# Patient Record
Sex: Female | Born: 2000 | Race: White | Hispanic: No | Marital: Single
Health system: Southern US, Community
[De-identification: ages and names within clinical notes are randomized; demographics above are authoritative.]

## PROBLEM LIST (undated history)

## (undated) DIAGNOSIS — F419 Anxiety disorder, unspecified: Secondary | ICD-10-CM

## (undated) DIAGNOSIS — F909 Attention-deficit hyperactivity disorder, unspecified type: Secondary | ICD-10-CM

## (undated) HISTORY — PX: NO PAST SURGERIES: SHX2092

---
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
## 2022-01-09 IMAGING — CR DG THORACIC SPINE 2V
3 series · 3 of 3 positions shown · non-contrast
Comparison: None.

CLINICAL DATA: Mid back pain for years.

EXAM:
THORACIC SPINE 2 VIEWS

[t-spine ap]
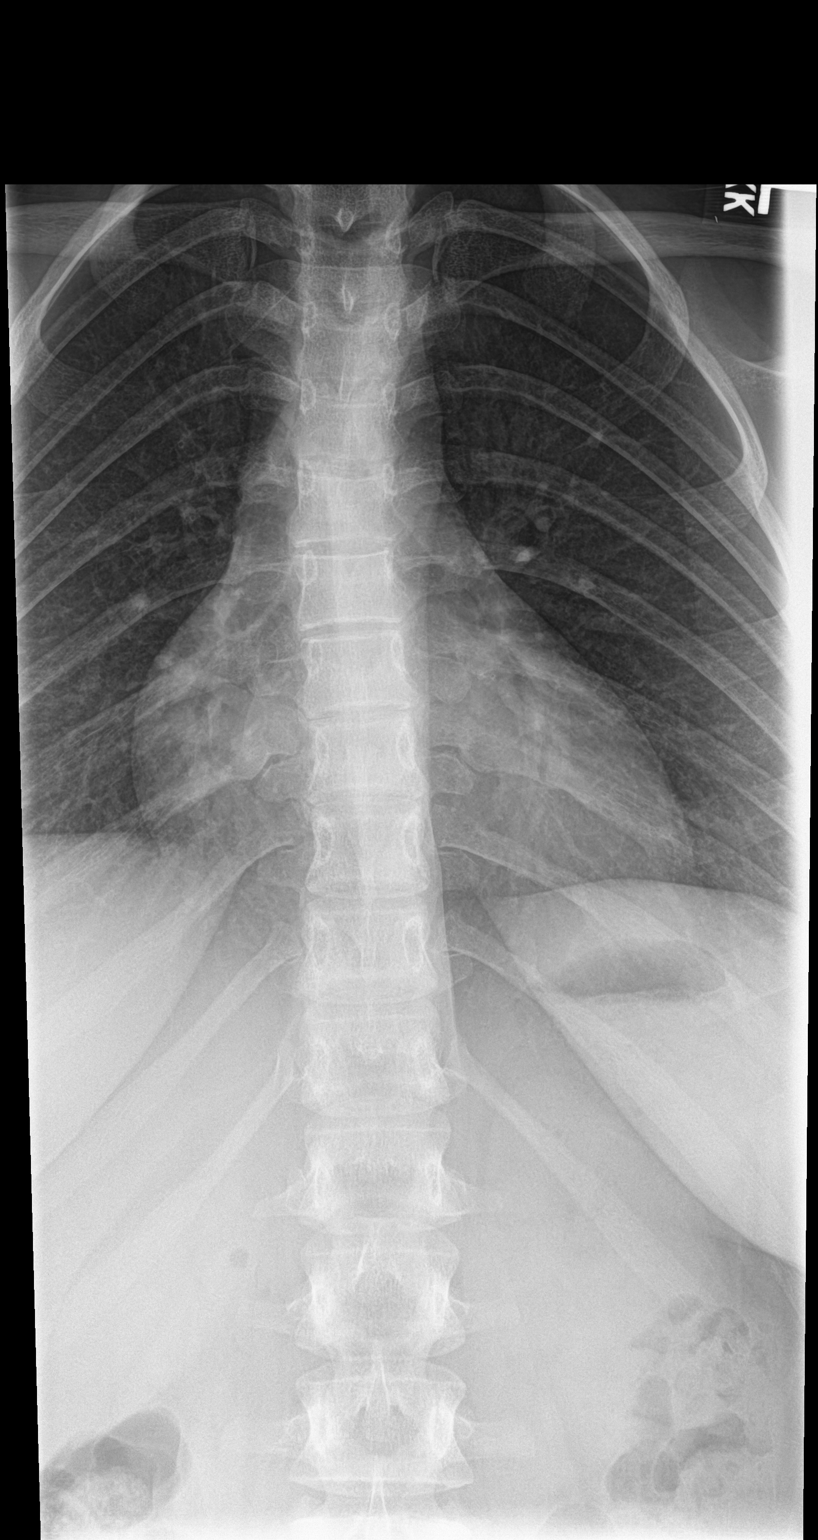

[t-spine lat]
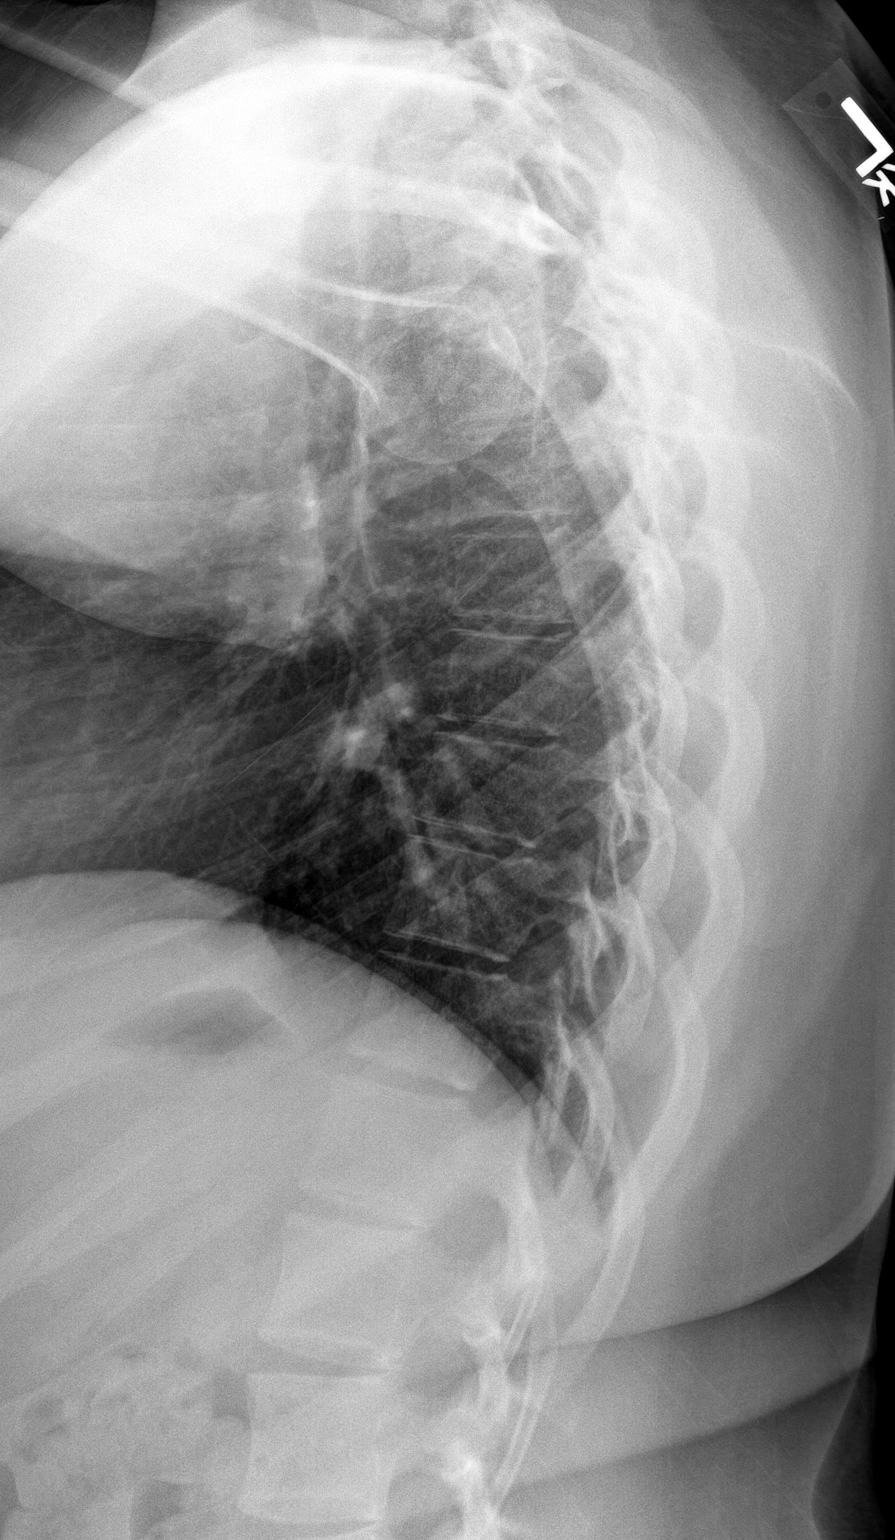

[t-spine swimmers]
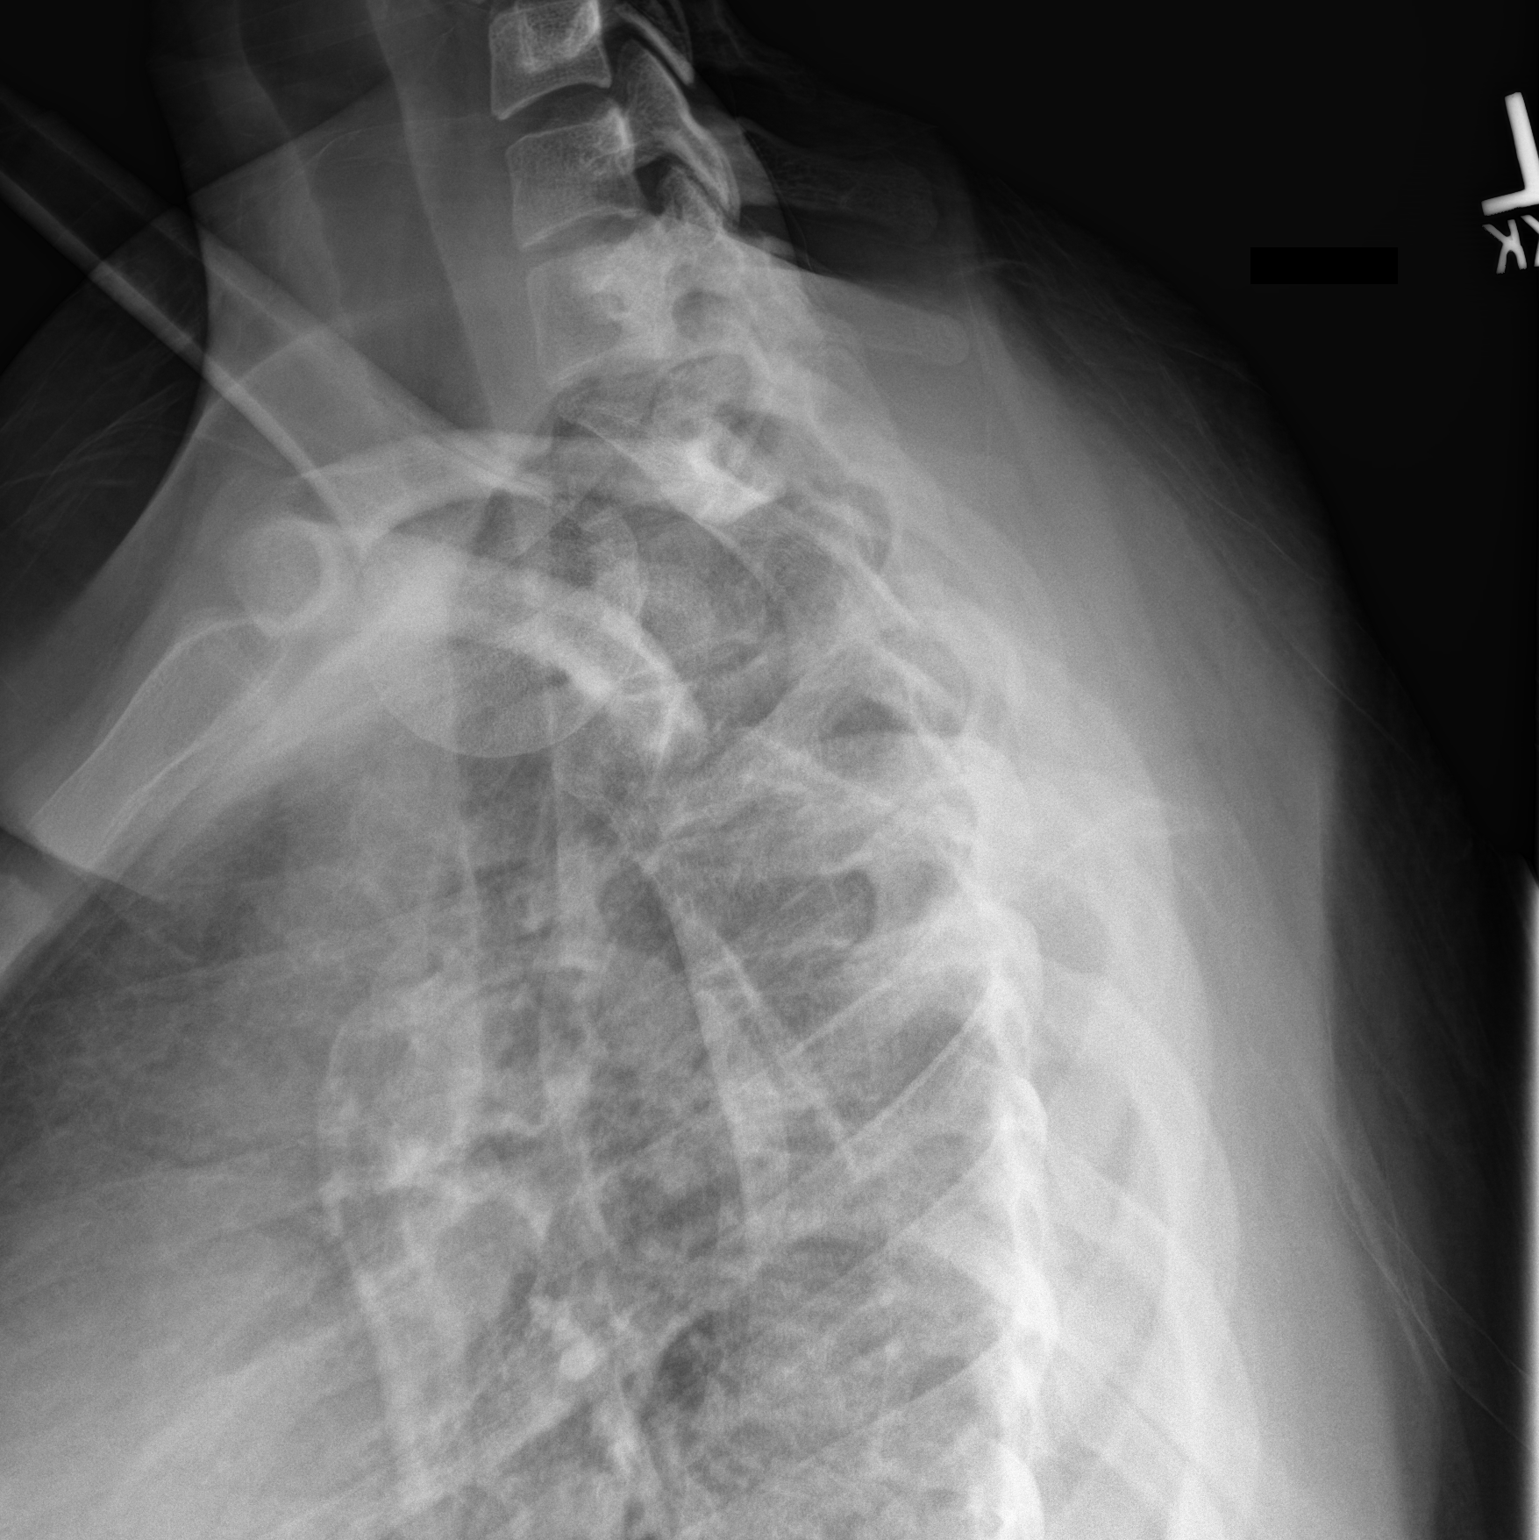

[3 of 3 positions shown; findings below may reference images not displayed]

FINDINGS: There is no evidence of thoracic spine fracture. Alignment is
normal. No other significant bone abnormalities are identified.
IMPRESSION: Negative.

## 2022-07-22 DIAGNOSIS — H5213 Myopia, bilateral: Secondary | ICD-10-CM | POA: Diagnosis not present

## 2023-05-02 ENCOUNTER — Emergency Department: Payer: BC Managed Care – PPO

## 2023-05-02 ENCOUNTER — Emergency Department
Admission: EM | Admit: 2023-05-02 | Discharge: 2023-05-02 | Disposition: A | Discharge status: Home / Self Care | Payer: BC Managed Care – PPO | Attending: Emergency Medicine | Admitting: Emergency Medicine

## 2023-05-02 ENCOUNTER — Other Ambulatory Visit: Payer: Self-pay

## 2023-05-02 DIAGNOSIS — R059 Cough, unspecified: Secondary | ICD-10-CM | POA: Diagnosis present

## 2023-05-02 DIAGNOSIS — Z1152 Encounter for screening for COVID-19: Secondary | ICD-10-CM | POA: Diagnosis not present

## 2023-05-02 DIAGNOSIS — J069 Acute upper respiratory infection, unspecified: Secondary | ICD-10-CM | POA: Diagnosis not present

## 2023-05-02 LAB — RESP PANEL BY RT-PCR (RSV, FLU A&B, COVID)  RVPGX2
Influenza A by PCR: NEGATIVE
Influenza B by PCR: NEGATIVE
Resp Syncytial Virus by PCR: NEGATIVE
SARS Coronavirus 2 by RT PCR: NEGATIVE

## 2023-05-02 LAB — GROUP A STREP BY PCR: Group A Strep by PCR: NOT DETECTED

## 2023-05-02 MED ORDER — BENZONATATE 100 MG PO CAPS: ORAL_CAPSULE | ORAL | 0 refills | Status: AC

## 2023-05-02 MED ORDER — PREDNISONE 20 MG PO TABS: 40.0000 mg | ORAL_TABLET | Freq: Every day | ORAL | 0 refills | Status: AC

## 2023-05-02 NOTE — ED Triage Notes (Signed)
Pt reports cough congestion x1.5 weeks and some swelling to her throat that began this morning. Pt denies difficulty breathing or swallowing, able to speak in complete sentences.

## 2023-05-02 NOTE — ED Provider Notes (Signed)
[facility] Emergency Department Provider Note     Event Date/Time   First MD Initiated Contact with Patient 05/02/23 1956     (approximate)   History   Cough   HPI  Terry Morris is a 22 y.o. female with a history of ADHD and anxiety, presents to the ED with 1-1/2-week complaint of cough and congestion.  Patient also reports some throat irritation that she describes as swelling.  Denies any difficulty breathing, swallowing, or controlling oral secretions.  She reports low-grade intermittent fevers in the early part of her course.  She has been taking DayQuil and NyQuil for symptom relief with limited benefit.  Physical Exam   Triage Vital Signs: ED Triage Vitals  Encounter Vitals Group     BP 05/02/23 1930 134/84     Systolic BP Percentile --      Diastolic BP Percentile --      Pulse Rate 05/02/23 1930 97     Resp 05/02/23 1930 18     Temp 05/02/23 1930 99 F (37.2 C)     Temp src --      SpO2 05/02/23 1930 98 %     Weight 05/02/23 1929 200 lb (90.7 kg)     Height 05/02/23 1929 5\' 2"  (1.575 m)     Head Circumference --      Peak Flow --      Pain Score 05/02/23 1929 8     Pain Loc --      Pain Education --      Exclude from Growth Chart --     Most recent vital signs: Vitals:   05/02/23 1930  BP: 134/84  Pulse: 97  Resp: 18  Temp: 99 F (37.2 C)  SpO2: 98%    General Awake, no distress. NAD HEENT NCAT. PERRL. EOMI. No rhinorrhea. Mucous membranes are moist.  Uvula is midline and tonsils are flat.  Oropharyngeal lesions appreciated. CV:  Good peripheral perfusion. RRR RESP:  Normal effort. CTA ABD:  No distention.    ED Results / Procedures / Treatments   Labs (all labs ordered are listed, but only abnormal results are displayed) Labs Reviewed  RESP PANEL BY RT-PCR (RSV, FLU A&B, COVID)  RVPGX2  GROUP A STREP BY PCR     EKG   RADIOLOGY  I personally viewed and evaluated these images as part of my medical decision  making, as well as reviewing the written report by the radiologist.  ED Provider Interpretation: No acute findings on chest x-ray interpreted by me  No results found.   PROCEDURES:  Critical Care performed: No  Procedures   MEDICATIONS ORDERED IN ED: Medications - No data to display   IMPRESSION / MDM / ASSESSMENT AND PLAN / ED COURSE  I reviewed the triage vital signs and the nursing notes.                              Differential diagnosis includes, but is not limited to, COVID, flu, RSV, strep pharyngitis, viral URI, bronchiolitis, CAP  Patient's presentation is most consistent with acute complicated illness / injury requiring diagnostic workup.  Patient's diagnosis is consistent with viral URI with cough. Patient will be discharged home with prescriptions for Tessalon Perles and prednisone.  She should follow-up with her primary provider as discussed. Patient is given ED precautions to return to the ED for any worsening or new symptoms.  FINAL CLINICAL  IMPRESSION(S) / ED DIAGNOSES   Final diagnoses:  Viral URI with cough     Rx / DC Orders   ED Discharge Orders          Ordered    benzonatate (TESSALON PERLES) 100 MG capsule        05/02/23 2117    predniSONE (DELTASONE) 20 MG tablet  Daily with breakfast        05/02/23 2117             Note:  This document was prepared using Dragon voice recognition software and may include unintentional dictation errors.    Lissa Hoard, PA-C 05/02/23 2119    Jene Every, MD 05/02/23 2141

## 2023-05-02 NOTE — Discharge Instructions (Addendum)
Take the prescription meds as directed.  Follow-up with your primary provider for ongoing concerns.  Return to the ED if necessary.

## 2023-09-01 DIAGNOSIS — S6992XA Unspecified injury of left wrist, hand and finger(s), initial encounter: Secondary | ICD-10-CM | POA: Diagnosis not present

## 2023-09-01 DIAGNOSIS — S62663A Nondisplaced fracture of distal phalanx of left middle finger, initial encounter for closed fracture: Secondary | ICD-10-CM | POA: Diagnosis not present

## 2023-10-10 DIAGNOSIS — J019 Acute sinusitis, unspecified: Secondary | ICD-10-CM | POA: Diagnosis not present

## 2023-10-10 DIAGNOSIS — Z03818 Encounter for observation for suspected exposure to other biological agents ruled out: Secondary | ICD-10-CM | POA: Diagnosis not present

## 2023-10-10 DIAGNOSIS — J40 Bronchitis, not specified as acute or chronic: Secondary | ICD-10-CM | POA: Diagnosis not present

## 2023-10-10 DIAGNOSIS — J029 Acute pharyngitis, unspecified: Secondary | ICD-10-CM | POA: Diagnosis not present

## 2023-10-10 DIAGNOSIS — B9689 Other specified bacterial agents as the cause of diseases classified elsewhere: Secondary | ICD-10-CM | POA: Diagnosis not present
